# Patient Record
Sex: Male | Born: 1940 | Race: Black or African American | Hispanic: No | Marital: Married | Smoking: Never smoker
Health system: Southern US, Community
[De-identification: ages and names within clinical notes are randomized; demographics above are authoritative.]

## PROBLEM LIST (undated history)

## (undated) DIAGNOSIS — I10 Essential (primary) hypertension: Secondary | ICD-10-CM

## (undated) DIAGNOSIS — E785 Hyperlipidemia, unspecified: Secondary | ICD-10-CM

---

## 2017-10-24 ENCOUNTER — Other Ambulatory Visit: Payer: Self-pay

## 2017-10-24 ENCOUNTER — Observation Stay
Admission: EM | Admit: 2017-10-24 | Discharge: 2017-10-25 | Disposition: A | Discharge status: Home / Self Care | Payer: Medicare Other | Attending: Internal Medicine | Admitting: Internal Medicine

## 2017-10-24 ENCOUNTER — Emergency Department: Payer: Medicare Other

## 2017-10-24 ENCOUNTER — Encounter: Payer: Self-pay | Admitting: Emergency Medicine

## 2017-10-24 DIAGNOSIS — I083 Combined rheumatic disorders of mitral, aortic and tricuspid valves: Secondary | ICD-10-CM | POA: Diagnosis not present

## 2017-10-24 DIAGNOSIS — Z79899 Other long term (current) drug therapy: Secondary | ICD-10-CM | POA: Diagnosis not present

## 2017-10-24 DIAGNOSIS — R42 Dizziness and giddiness: Secondary | ICD-10-CM

## 2017-10-24 DIAGNOSIS — Z7982 Long term (current) use of aspirin: Secondary | ICD-10-CM | POA: Diagnosis not present

## 2017-10-24 DIAGNOSIS — E876 Hypokalemia: Secondary | ICD-10-CM | POA: Insufficient documentation

## 2017-10-24 DIAGNOSIS — R55 Syncope and collapse: Principal | ICD-10-CM | POA: Insufficient documentation

## 2017-10-24 DIAGNOSIS — I208 Other forms of angina pectoris: Secondary | ICD-10-CM | POA: Diagnosis present

## 2017-10-24 DIAGNOSIS — E785 Hyperlipidemia, unspecified: Secondary | ICD-10-CM | POA: Diagnosis not present

## 2017-10-24 DIAGNOSIS — I10 Essential (primary) hypertension: Secondary | ICD-10-CM | POA: Insufficient documentation

## 2017-10-24 HISTORY — DX: Hyperlipidemia, unspecified: E78.5

## 2017-10-24 HISTORY — DX: Essential (primary) hypertension: I10

## 2017-10-24 LAB — ETHANOL: Alcohol, Ethyl (B): <10 mg/dL (ref <10)

## 2017-10-24 LAB — CBC WITH DIFFERENTIAL/PLATELET
BASOS ABS: 0.1 10*3/uL (ref 0–0.1)
BASOS PCT: 1 %
EOS PCT: 2 %
Eosinophils Absolute: 0.2 10*3/uL (ref 0–0.7)
HCT: 48.2 % (ref 40.0–52.0)
Hemoglobin: 16.3 g/dL (ref 13.0–18.0)
Lymphocytes Relative: 9 %
Lymphs Abs: 0.8 10*3/uL — ABNORMAL LOW (ref 1.0–3.6)
MCH: 30 pg (ref 26.0–34.0)
MCHC: 33.9 g/dL (ref 32.0–36.0)
MCV: 88.6 fL (ref 80.0–100.0)
MONO ABS: 0.7 10*3/uL (ref 0.2–1.0)
Monocytes Relative: 8 %
Neutro Abs: 7 10*3/uL — ABNORMAL HIGH (ref 1.4–6.5)
Neutrophils Relative %: 80 %
PLATELETS: 201 10*3/uL (ref 150–440)
RBC: 5.45 MIL/uL (ref 4.40–5.90)
RDW: 13.9 % (ref 11.5–14.5)
WBC: 8.9 10*3/uL (ref 3.8–10.6)

## 2017-10-24 LAB — COMPREHENSIVE METABOLIC PANEL
ALT: 19 U/L (ref 17–63)
AST: 28 U/L (ref 15–41)
Albumin: 3.7 g/dL (ref 3.5–5.0)
Alkaline Phosphatase: 68 U/L (ref 38–126)
Anion gap: 10 (ref 5–15)
BILIRUBIN TOTAL: 0.9 mg/dL (ref 0.3–1.2)
BUN: 17 mg/dL (ref 6–20)
CALCIUM: 9.2 mg/dL (ref 8.9–10.3)
CO2: 25 mmol/L (ref 22–32)
CREATININE: 1.14 mg/dL (ref 0.61–1.24)
Chloride: 103 mmol/L (ref 101–111)
GFR calc Af Amer: >60 mL/min (ref >60)
GFR calc non Af Amer: 59 mL/min — ABNORMAL LOW (ref >60)
Glucose, Bld: 101 mg/dL — ABNORMAL HIGH (ref 65–99)
Potassium: 3.6 mmol/L (ref 3.5–5.1)
SODIUM: 138 mmol/L (ref 135–145)
TOTAL PROTEIN: 7.9 g/dL (ref 6.5–8.1)

## 2017-10-24 LAB — URINALYSIS, COMPLETE (UACMP) WITH MICROSCOPIC
BILIRUBIN URINE: NEGATIVE
GLUCOSE, UA: NEGATIVE mg/dL
Hgb urine dipstick: NEGATIVE
KETONES UR: NEGATIVE mg/dL
LEUKOCYTES UA: NEGATIVE
Nitrite: NEGATIVE
PROTEIN: NEGATIVE mg/dL
Specific Gravity, Urine: 1.017 (ref 1.005–1.030)
pH: 6 (ref 5.0–8.0)

## 2017-10-24 LAB — URINE DRUG SCREEN, QUALITATIVE (ARMC ONLY)
Amphetamines, Ur Screen: NOT DETECTED
BENZODIAZEPINE, UR SCRN: NOT DETECTED
CANNABINOID 50 NG, UR ~~LOC~~: NOT DETECTED
Cocaine Metabolite,Ur ~~LOC~~: NOT DETECTED
MDMA (Ecstasy)Ur Screen: NOT DETECTED
METHADONE SCREEN, URINE: NOT DETECTED
OPIATE, UR SCREEN: NOT DETECTED
PHENCYCLIDINE (PCP) UR S: NOT DETECTED
Tricyclic, Ur Screen: NOT DETECTED

## 2017-10-24 LAB — BRAIN NATRIURETIC PEPTIDE: B NATRIURETIC PEPTIDE 5: 48 pg/mL (ref 0.0–100.0)

## 2017-10-24 LAB — TROPONIN I
Troponin I: 0.03 ng/mL (ref <0.03)
Troponin I: <0.03 ng/mL (ref <0.03)

## 2017-10-24 LAB — PROTIME-INR
INR: 1.02
PROTHROMBIN TIME: 13.3 s (ref 11.4–15.2)

## 2017-10-24 LAB — GLUCOSE, CAPILLARY: Glucose-Capillary: 119 mg/dL — ABNORMAL HIGH (ref 65–99)

## 2017-10-24 LAB — APTT: aPTT: 24 seconds (ref 24–36)

## 2017-10-24 IMAGING — DX DG CHEST 1V PORT
2 series · 2 of 2 positions shown · non-contrast
Comparison: None.

CLINICAL DATA: Chest pain with weakness

EXAM:
PORTABLE CHEST 1 VIEW

[chest ap (1 of 2)]
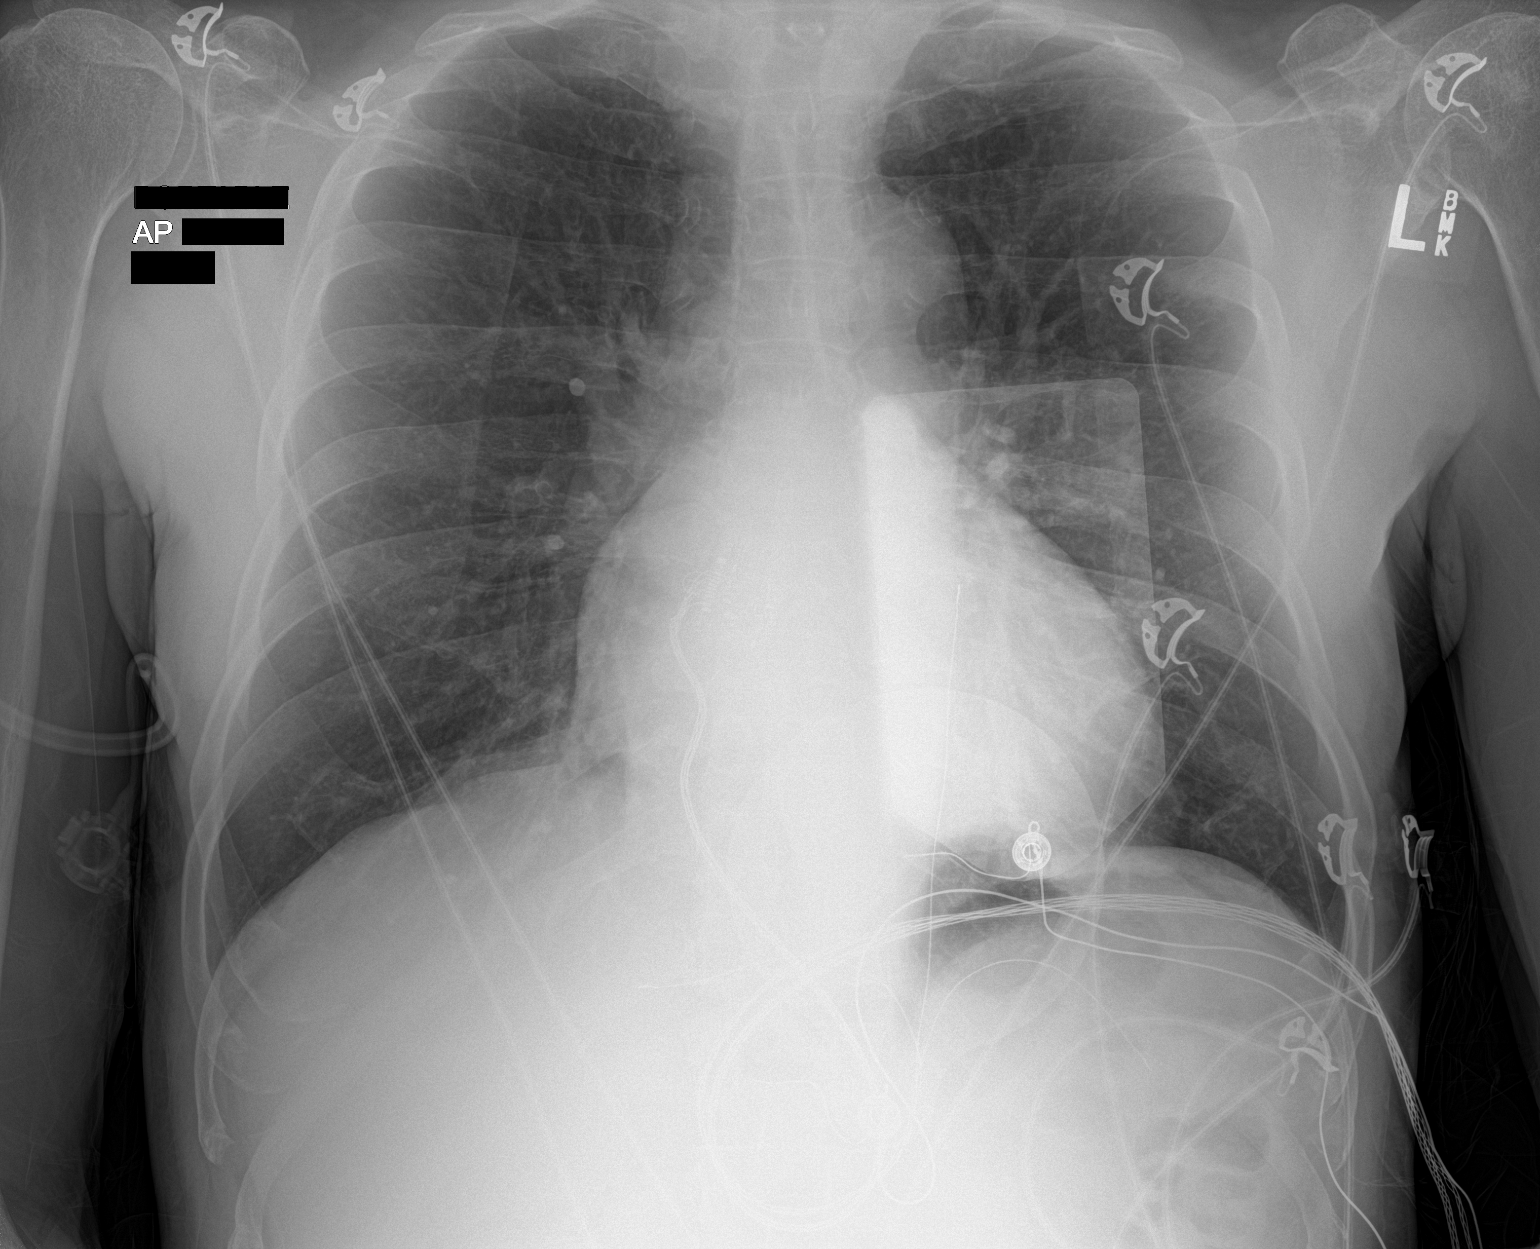

[chest ap (2 of 2)]
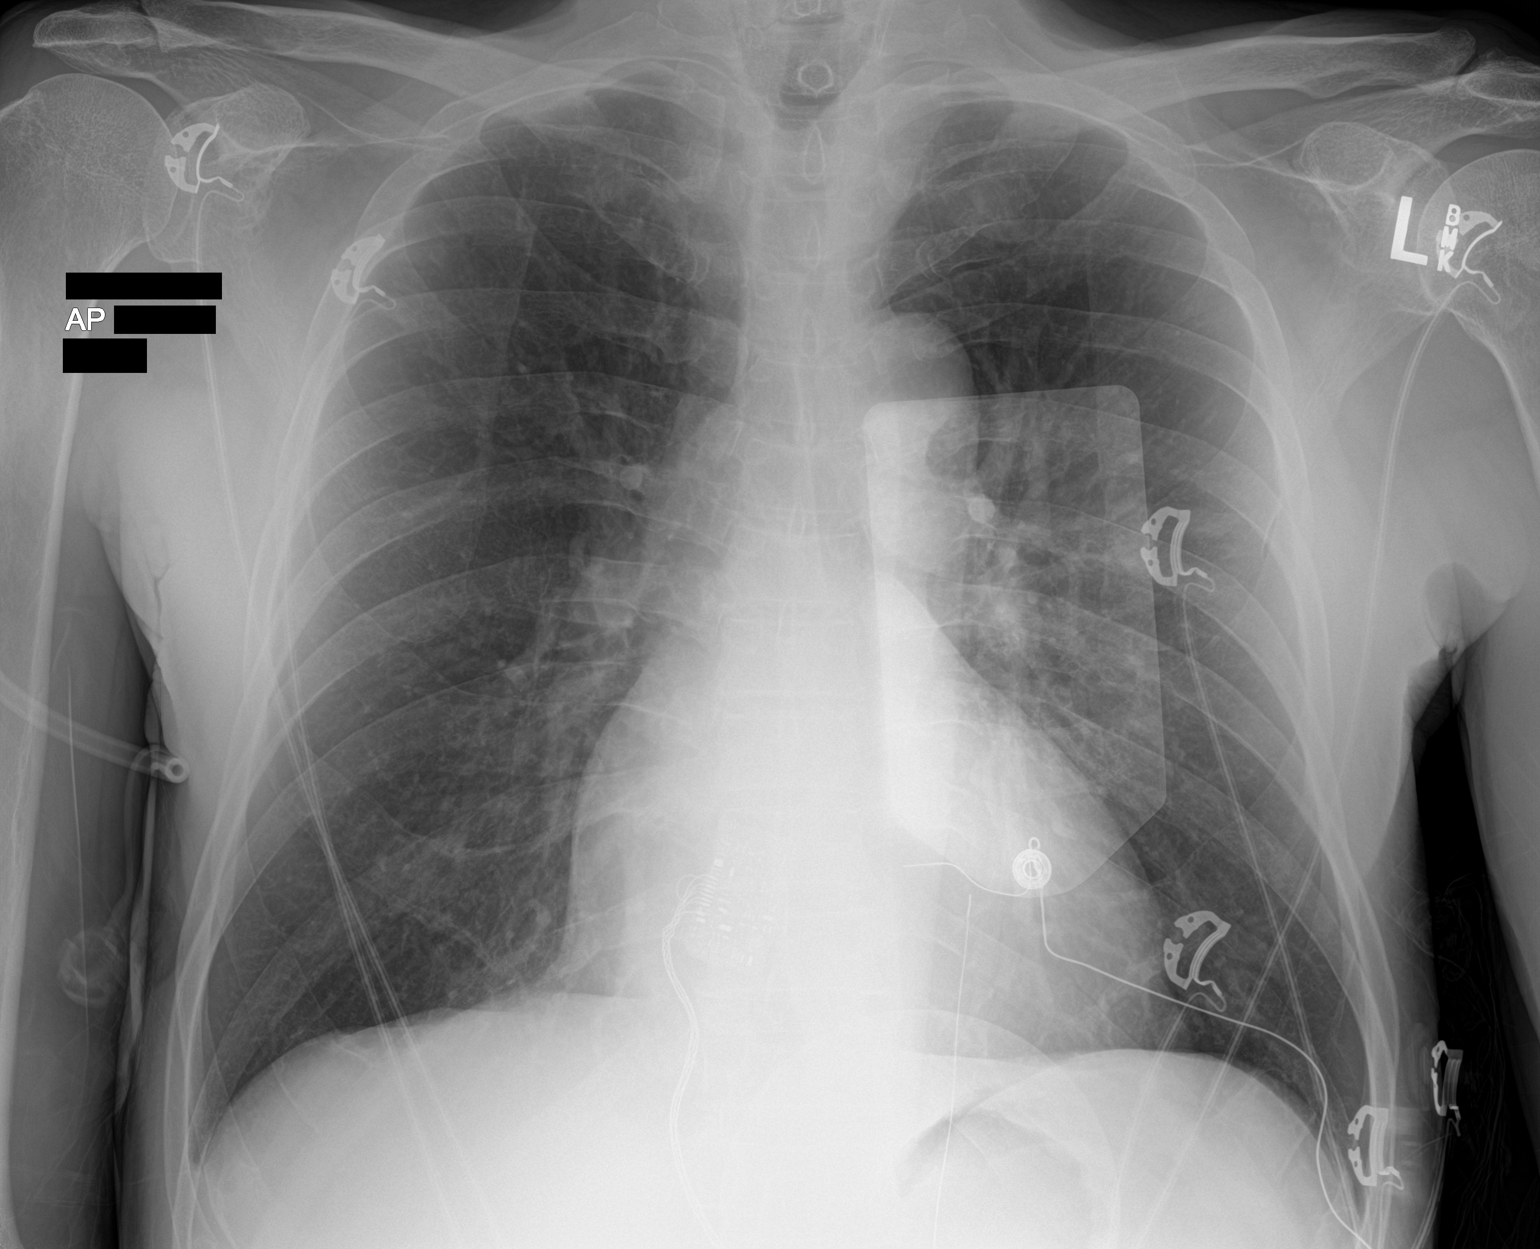

[2 of 2 positions shown; findings below may reference images not displayed]

FINDINGS: No focal airspace disease or pleural effusion. Heart size upper
limits of normal. No pneumothorax.
IMPRESSION: No active disease.

## 2017-10-24 MED ORDER — ONDANSETRON HCL 4 MG/2ML IJ SOLN: 4.0000 mg | Freq: Four times a day (QID) | INTRAMUSCULAR | Status: DC | PRN

## 2017-10-24 MED ORDER — ACETAMINOPHEN 325 MG PO TABS: 650.0000 mg | ORAL_TABLET | Freq: Four times a day (QID) | ORAL | Status: DC | PRN

## 2017-10-24 MED ORDER — ACETAMINOPHEN 650 MG RE SUPP: 650.0000 mg | Freq: Four times a day (QID) | RECTAL | Status: DC | PRN

## 2017-10-24 MED ORDER — ALBUTEROL SULFATE (2.5 MG/3ML) 0.083% IN NEBU: 2.5000 mg | INHALATION_SOLUTION | RESPIRATORY_TRACT | Status: DC | PRN

## 2017-10-24 MED ORDER — HEPARIN (PORCINE) IN NACL 100-0.45 UNIT/ML-% IJ SOLN
850.0000 [IU]/h | INTRAMUSCULAR | Status: DC
  Administered 2017-10-24: 850 [IU]/h via INTRAVENOUS
  Filled 2017-10-24: qty 250

## 2017-10-24 MED ORDER — SODIUM CHLORIDE 0.9% FLUSH: 3.0000 mL | Freq: Two times a day (BID) | INTRAVENOUS | Status: DC

## 2017-10-24 MED ORDER — POLYETHYLENE GLYCOL 3350 17 G PO PACK: 17.0000 g | PACK | Freq: Every day | ORAL | Status: DC | PRN

## 2017-10-24 MED ORDER — SODIUM CHLORIDE 0.9 % IV BOLUS
500.0000 mL | Freq: Once | INTRAVENOUS | Status: AC
  Administered 2017-10-24: 500 mL via INTRAVENOUS

## 2017-10-24 MED ORDER — PRAVASTATIN SODIUM 40 MG PO TABS: 40.0000 mg | ORAL_TABLET | Freq: Every day | ORAL | Status: DC

## 2017-10-24 MED ORDER — ONDANSETRON HCL 4 MG PO TABS: 4.0000 mg | ORAL_TABLET | Freq: Four times a day (QID) | ORAL | Status: DC | PRN

## 2017-10-24 MED ORDER — LIDOCAINE HCL (PF) 1 % IJ SOLN
INTRAMUSCULAR | Status: AC
  Filled 2017-10-24: qty 30

## 2017-10-24 MED ORDER — HEPARIN BOLUS VIA INFUSION
4000.0000 [IU] | Freq: Once | INTRAVENOUS | Status: AC
  Administered 2017-10-24: 4000 [IU] via INTRAVENOUS
  Filled 2017-10-24: qty 4000

## 2017-10-24 MED ORDER — ASPIRIN EC 81 MG PO TBEC: 81.0000 mg | DELAYED_RELEASE_TABLET | Freq: Every day | ORAL | Status: DC

## 2017-10-24 NOTE — Progress Notes (Signed)
   10/24/17 1810  Clinical Encounter Type  Visited With Family (fiance of patient)  Visit Type Initial   Code STEMI page. Chaplain made contact with fianc of patient, awaiting more family to arrive.  Patient being treated by medical staff, but Code STEMI was cancelled. Provided emotional support to fianc and encouraged her to request on-call chaplain if needed.

## 2017-10-24 NOTE — H&P (Signed)
[employer] - [place] [facility]   PATIENT NAME: Joel Morgan    MR#:  [id number]  DATE OF BIRTH:  Feb 26, 1941  DATE OF ADMISSION:  10/24/2017  PRIMARY CARE PHYSICIAN: Leanna SatoMiles, Linda M, MD   REQUESTING/REFERRING PHYSICIAN: Dr. Alphonzo LemmingsMcshane  CHIEF COMPLAINT:   Chief Complaint  Patient presents with  . Chest Pain    HISTORY OF PRESENT ILLNESS:  Joel Morgan  is a 77 y.o. male with a known history of hypertension, hyperlipidemia presented to the emergency room due to episode of almost passing out and severe diaphoresis. EMS saw the patient and an EKG was checked and code NSTEMI activated. EKG showed mild ST elevation in inferior leads and reciprocal changes with ST depression in inferior leads. Troponin normal. Seen by cardiology. Did not think this was ST elevation MI. Patient is being admitted on heparin drip. Presently patient feels back to normal. No concerns other than some fatigue.  PAST MEDICAL HISTORY:   Past Medical History:  Diagnosis Date  . Hyperlipidemia   . Hypertension     PAST SURGICAL HISTORY:  History reviewed. No pertinent surgical history.  SOCIAL HISTORY:   Social History   Tobacco Use  . Smoking status: Never Smoker  . Smokeless tobacco: Never Used  Substance Use Topics  . Alcohol use: Not Currently    Frequency: Never    FAMILY HISTORY:  History reviewed. No pertinent family history. no premature CAD in the family  DRUG ALLERGIES:  No Known Allergies  REVIEW OF SYSTEMS:   Review of Systems  Constitutional: Positive for diaphoresis and malaise/fatigue. Negative for chills and fever.  HENT: Negative for sore throat.   Eyes: Negative for blurred vision, double vision and pain.  Respiratory: Negative for cough, hemoptysis, shortness of breath and wheezing.   Cardiovascular: Negative for chest pain, palpitations, orthopnea and leg swelling.  Gastrointestinal: Negative for abdominal pain, constipation, diarrhea, heartburn,  nausea and vomiting.  Genitourinary: Negative for dysuria and hematuria.  Musculoskeletal: Negative for back pain and joint pain.  Skin: Negative for rash.  Neurological: Positive for dizziness. Negative for sensory change, speech change, focal weakness and headaches.  Endo/Heme/Allergies: Does not bruise/bleed easily.  Psychiatric/Behavioral: Negative for depression. The patient is not nervous/anxious.     MEDICATIONS AT HOME:   Prior to Admission medications   Not on File     VITAL SIGNS:  Blood pressure (!) 144/84, pulse 64, temperature 99.1 F (37.3 C), temperature source Oral, resp. rate 16, height 6' (1.829 m), weight 71.1 kg (156 lb 11.2 oz), SpO2 95 %.  PHYSICAL EXAMINATION:  Physical Exam  GENERAL:  77 y.o.-year-old patient lying in the bed with no acute distress.  EYES: Pupils equal, round, reactive to light and accommodation. No scleral icterus. Extraocular muscles intact.  HEENT: Head atraumatic, normocephalic. Oropharynx and nasopharynx clear. No oropharyngeal erythema, moist oral mucosa  NECK:  Supple, no jugular venous distention. No thyroid enlargement, no tenderness.  LUNGS: Normal breath sounds bilaterally, no wheezing, rales, rhonchi. No use of accessory muscles of respiration.  CARDIOVASCULAR: S1, S2 normal. No murmurs, rubs, or gallops.  ABDOMEN: Soft, nontender, nondistended. Bowel sounds present. No organomegaly or mass.  EXTREMITIES: No pedal edema, cyanosis, or clubbing. + 2 pedal & radial pulses b/l.   NEUROLOGIC: Cranial nerves II through XII are intact. No focal Motor or sensory deficits appreciated b/l PSYCHIATRIC: The patient is alert and oriented x 3. Good affect.  SKIN: No obvious rash, lesion, or ulcer.   LABORATORY PANEL:  CBC Recent Labs  Lab 10/24/17 1820  WBC 8.9  HGB 16.3  HCT 48.2  PLT 201   ------------------------------------------------------------------------------------------------------------------  Chemistries  Recent Labs   Lab 10/24/17 1820  NA 138  K 3.6  CL 103  CO2 25  GLUCOSE 101*  BUN 17  CREATININE 1.14  CALCIUM 9.2  AST 28  ALT 19  ALKPHOS 68  BILITOT 0.9   ------------------------------------------------------------------------------------------------------------------  Cardiac Enzymes Recent Labs  Lab 10/24/17 1820  TROPONINI 0.03*   ------------------------------------------------------------------------------------------------------------------  RADIOLOGY:  Dg Chest Portable 1 View  Result Date: 10/24/2017 CLINICAL DATA:  Chest pain with weakness EXAM: PORTABLE CHEST 1 VIEW COMPARISON:  None. FINDINGS: No focal airspace disease or pleural effusion. Heart size upper limits of normal. No pneumothorax. IMPRESSION: No active disease. Electronically Signed   By: Jasmine Pang M.D.   On: 10/24/2017 18:55     IMPRESSION AND PLAN:   *Angina equivalent patient presented with pre-syncope and severe diaphoresis. His EKG initially showed some ST elevation and lateral leads and reciprocal changes in the inferior leads. These have improved on repeat EKG. Troponin normal. Code STEMI was activated. Cardiology Dr. Adrienne Mocha has seen the patient and reviewed EKG. Do not think this was ST elevation MI. Troponin normal. Patient is being admitted on heparin drip. Will start aspirin, statin. Check echocardiogram. Patient will be kept NPO after midnight in anticipation of cardiac catheterization in the morning. Check orthostatic vitals.  *Hypertension. Patient on hydrochlorothiazide at home. Will hold this.  All the records are reviewed and case discussed with ED provider. Management plans discussed with the patient, family and they are in agreement.  CODE STATUS: FULL CODE  TOTAL TIME TAKING CARE OF THIS PATIENT: 40 minutes.   Molinda Bailiff Sebrena Engh M.D on 10/24/2017 at 8:07 PM  Between 7am to 6pm - Pager - [phone]  After 6pm go to [email or web address] - password [email or web address] [facility]  [facility] [place]  [facility]  Office  [phone]  CC: Primary care physician; Leanna Sato, MD  Note: This dictation was prepared with Dragon dictation along with smaller phrase technology. Any transcriptional errors that result from this process are unintentional.

## 2017-10-24 NOTE — ED Triage Notes (Signed)
Pt to ED by EMS with c/o of increased weakness. Per EMS pt had EKG that was concerning and possible STEMI. Pt has no c/o chest pain or SOB.

## 2017-10-24 NOTE — ED Provider Notes (Addendum)
[facility] Emergency Department Provider Note  ____________________________________________   I have reviewed the triage vital signs and the nursing notes. Where available I have reviewed prior notes and, if possible and indicated, outside hospital notes.    HISTORY  Chief Complaint Chest Pain    HPI Joel Morgan is a 77 y.o. male immediately upon arrival to room, patient is here as a "STEMI".  Patient has history of hypertension hyperlipidemia last negative stress test was in April of this year.  Patient states that he has been feeling generally fatigued today.  He states he had an episode where he became diaphoretic while he was sitting down and thought he might pass out or actually did but did not fall.  He was "kind of out of it" for a few minutes.  EMS was called when they got there they had an EKG which showed RSR prime configuration with ST elevations anteriorly and reciprocal changes inferiorly, EMS began ACLS protocol and gave him nitroglycerin and aspirin, they were not able to get a blood pressure after that, however patient at no time had any chest pain or shortness of breath he states sometimes he does have exertional shortness of breath but no more than usual recently.  Patient states that he has had no leg swelling no rectal bleeding no fever no chills.  He has had minor URI symptoms with rhinorrhea and cold.  He denies falling or hitting his head. This time he feels "pretty good".  Past Medical History:  Diagnosis Date  . Hyperlipidemia   . Hypertension     There are no active problems to display for this patient.   History reviewed. No pertinent surgical history.  Prior to Admission medications   Not on File    Allergies Patient has no known allergies.  History reviewed. No pertinent family history.  Social History Social History   Tobacco Use  . Smoking status: Never Smoker  . Smokeless tobacco: Never Used  Substance Use  Topics  . Alcohol use: Not Currently    Frequency: Never  . Drug use: Never    Review of Systems Constitutional: No fever/chills Eyes: No visual changes. ENT: No sore throat. No stiff neck no neck pain Cardiovascular: Denies chest pain. Respiratory: Denies shortness of breath. Gastrointestinal:   no vomiting.  No diarrhea.  No constipation. Genitourinary: Negative for dysuria. Musculoskeletal: Negative lower extremity swelling Skin: Negative for rash. Neurological: Negative for severe headaches, focal weakness or numbness.   ____________________________________________   PHYSICAL EXAM:  VITAL SIGNS: ED Triage Vitals  Enc Vitals Group     BP 10/24/17 1825 (!) 80/60     Pulse Rate 10/24/17 1825 64     Resp 10/24/17 1825 15     Temp 10/24/17 1825 99.1 F (37.3 C)     Temp Source 10/24/17 1825 Oral     SpO2 10/24/17 1825 95 %     Weight 10/24/17 1831 156 lb 11.2 oz (71.1 kg)     Height 10/24/17 1831 6' (1.829 m)     Head Circumference --      Peak Flow --      Pain Score 10/24/17 1825 0     Pain Loc --      Pain Edu? --      Excl. in GC? --     Constitutional: Alert and oriented. Well appearing and in no acute distress. Eyes: Conjunctivae are normal Head: Atraumatic HEENT: No congestion/rhinnorhea. Mucous membranes are moist.  Oropharynx non-erythematous  Neck:   Nontender with no meningismus, no masses, no stridor Cardiovascular: Normal rate, regular rhythm. Grossly normal heart sounds.  Good peripheral circulation. Respiratory: Normal respiratory effort.  No retractions. Lungs CTAB. Abdominal: Soft and nontender. No distention. No guarding no rebound Back:  There is no focal tenderness or step off.  there is no midline tenderness there are no lesions noted. there is no CVA tenderness Musculoskeletal: No lower extremity tenderness, no upper extremity tenderness. No joint effusions, no DVT signs strong distal pulses no edema Neurologic:  Normal speech and language. No  gross focal neurologic deficits are appreciated.  Skin:  Skin is warm, dry and intact. No rash noted. Psychiatric: Mood and affect are normal. Speech and behavior are normal.  ____________________________________________   LABS (all labs ordered are listed, but only abnormal results are displayed)  Labs Reviewed  GLUCOSE, CAPILLARY - Abnormal; Notable for the following components:      Result Value   Glucose-Capillary 119 (*)    All other components within normal limits  COMPREHENSIVE METABOLIC PANEL  CBC WITH DIFFERENTIAL/PLATELET  TROPONIN I  BRAIN NATRIURETIC PEPTIDE  PROTIME-INR  APTT    Pertinent labs  results that were available during my care of the patient were reviewed by me and considered in my medical decision making (see chart for details). ____________________________________________  EKG  I personally interpreted any EKGs ordered by me or triage EKG from EMS shows RSR prime configuration with some ST elevation which could be repolarization, flipped T waves noted to 3 and aVF, we do not have an old EKG to compare to, EKG here shows RSR prime configuration with ST elevation which could be repolarization in V1 V2, flipped T waves laterally and no reciprocal changes noted inferiorly. ____________________________________________  RADIOLOGY  Pertinent labs & imaging results that were available during my care of the patient were reviewed by me and considered in my medical decision making (see chart for details). If possible, patient and/or family made aware of any abnormal findings.  No results found. ____________________________________________    PROCEDURES  Procedure(s) performed: None  Procedures  Critical Care performed: CRITICAL CARE Performed by: Jeanmarie Plant   Total critical care time: 39 minutes  Critical care time was exclusive of separately billable procedures and treating other patients.  Critical care was necessary to treat or prevent  imminent or life-threatening deterioration.  Critical care was time spent personally by me on the following activities: development of treatment plan with patient and/or surrogate as well as nursing, discussions with consultants, evaluation of patient's response to treatment, examination of patient, obtaining history from patient or surrogate, ordering and performing treatments and interventions, ordering and review of laboratory studies, ordering and review of radiographic studies, pulse oximetry and re-evaluation of patient's condition.   ____________________________________________   INITIAL IMPRESSION / ASSESSMENT AND PLAN / ED COURSE  Pertinent labs & imaging results that were available during my care of the patient were reviewed by me and considered in my medical decision making (see chart for details).  She with syncopal or presyncopal symptoms today, but with no chest pain or shortness of breath, initial blood pressure was in the 80s after he received nitroglycerin from EMS, gave IV fluids that rapidly directed.  He has no symptoms at this time..  EKG at this time is nondiagnostic of definitive STEMI, old EKGs from notes show that he has a repull abnormality with ST elevation it appears according to cardiology.  I was able to talk to cardiologist, Dr. Darrold Junker,  who did come and evaluate the patient.  He does not feel the patient meets criteria for emergent trip to the Cath Lab especially as he has no chest pain or shortness of breath.  Or EKG certainly is not sufficient in his estimation to mandate that.  He does agree with a general work-up for syncope and lightheadedness which we have initiated, no evidence of head trauma.  We will admit the patient.  If that changes in any way including chest pain etc. we will reconsult cardiology in the emergency department.  He does feel that if no other acute cause of patient's symptoms is found, that heparinization would not be a bad plan but he does  wish to wait for blood work etc. to make sure that there are no other causes such as GI bleed etc.  Historically however this is not something the patient complains of.  Patient remains asymptomatic with a reassuring blood pressure and vitals at this time    ____________________________________________   FINAL CLINICAL IMPRESSION(S) / ED DIAGNOSES  Final diagnoses:  None      This chart was dictated using voice recognition software.  Despite best efforts to proofread,  errors can occur which can change meaning.      Jeanmarie Plant, MD 10/24/17 1844    Jeanmarie Plant, MD 10/24/17 [phone]

## 2017-10-24 NOTE — Consult Note (Signed)
ANTICOAGULATION CONSULT NOTE - Initial Consult  Pharmacy Consult for heparin drip Indication: chest pain/ACS  No Known Allergies  Patient Measurements: Height: 6' (182.9 cm) Weight: 156 lb 11.2 oz (71.1 kg) IBW/kg (Calculated) : 77.6 Heparin Dosing Weight: 71.1kg  Vital Signs: Temp: 99.1 F (37.3 C) (06/21 1825) Temp Source: Oral (06/21 1825) BP: 144/84 (06/21 1830) Pulse Rate: 64 (06/21 1830)  Labs: Recent Labs    10/24/17 1820  HGB 16.3  HCT 48.2  PLT 201  APTT 24  LABPROT 13.3  INR 1.02  CREATININE 1.14  TROPONINI 0.03*    Estimated Creatinine Clearance: 52.8 mL/min (by C-G formula based on SCr of 1.14 mg/dL).   Medical History: Past Medical History:  Diagnosis Date  . Hyperlipidemia   . Hypertension     Medications:  Scheduled:  . heparin  4,000 Units Intravenous Once    Assessment: Patient is a 77 year old male who initially was called a code STEMI, but this was canceled. Pharmacy consulted to dose heparin drip for possible ACS. First troponin slightly elevated. Baseline labs WNL. No anticoagulation PTA.  Goal of Therapy:  Heparin level 0.3-0.7 units/ml Monitor platelets by anticoagulation protocol: Yes   Plan:  Give 4000 units bolus x 1 Start heparin infusion at 850 units/hr Check anti-Xa level in 6 hours and daily while on heparin Continue to monitor H&H and platelets  Ranie Chinchilla D Tiasha Helvie, Pharm.D, BCPS Clinical Pharmacist  10/24/2017,7:20 PM

## 2017-10-25 ENCOUNTER — Inpatient Hospital Stay: Payer: Medicare Other

## 2017-10-25 ENCOUNTER — Inpatient Hospital Stay
Admit: 2017-10-25 | Discharge: 2017-10-25 | Disposition: A | Discharge status: Home / Self Care | Payer: Medicare Other | Attending: Internal Medicine | Admitting: Internal Medicine

## 2017-10-25 ENCOUNTER — Other Ambulatory Visit: Payer: Self-pay

## 2017-10-25 DIAGNOSIS — R42 Dizziness and giddiness: Secondary | ICD-10-CM

## 2017-10-25 DIAGNOSIS — R55 Syncope and collapse: Secondary | ICD-10-CM

## 2017-10-25 LAB — NM MYOCAR MULTI W/SPECT W/WALL MOTION / EF
CHL CUP MPHR: 141 {beats}/min
Estimated workload: 1 METS
Exercise duration (min): 1 min
Exercise duration (sec): 0 s
LVDIAVOL: 83 mL (ref 62–150)
LVSYSVOL: 32 mL
NUC STRESS TID: 1.07
Peak HR: 89 {beats}/min
Percent HR: 63 %
Rest HR: 57 {beats}/min
SDS: 5
SRS: 0
SSS: 5

## 2017-10-25 LAB — ECHOCARDIOGRAM COMPLETE
Height: 72 in
Weight: 2448 oz

## 2017-10-25 LAB — BASIC METABOLIC PANEL
ANION GAP: 11 (ref 5–15)
BUN: 18 mg/dL (ref 6–20)
CALCIUM: 8.7 mg/dL — AB (ref 8.9–10.3)
CO2: 23 mmol/L (ref 22–32)
CREATININE: 0.94 mg/dL (ref 0.61–1.24)
Chloride: 103 mmol/L (ref 101–111)
GFR calc Af Amer: >60 mL/min (ref >60)
GLUCOSE: 91 mg/dL (ref 65–99)
Potassium: 3.4 mmol/L — ABNORMAL LOW (ref 3.5–5.1)
Sodium: 137 mmol/L (ref 135–145)

## 2017-10-25 LAB — TROPONIN I: TROPONIN I: 0.03 ng/mL — AB (ref <0.03)

## 2017-10-25 LAB — HEPARIN LEVEL (UNFRACTIONATED): Heparin Unfractionated: 0.53 IU/mL (ref 0.30–0.70)

## 2017-10-25 MED ORDER — REGADENOSON 0.4 MG/5ML IV SOLN
0.4000 mg | Freq: Once | INTRAVENOUS | Status: AC
  Administered 2017-10-25: 0.4 mg via INTRAVENOUS

## 2017-10-25 MED ORDER — LOSARTAN POTASSIUM 50 MG PO TABS: 50.0000 mg | ORAL_TABLET | Freq: Every day | ORAL | 2 refills | Status: AC

## 2017-10-25 MED ORDER — TECHNETIUM TC 99M TETROFOSMIN IV KIT
13.8300 | PACK | Freq: Once | INTRAVENOUS | Status: AC | PRN
  Administered 2017-10-25: 13.83 via INTRAVENOUS

## 2017-10-25 MED ORDER — POTASSIUM CHLORIDE CRYS ER 20 MEQ PO TBCR: 40.0000 meq | EXTENDED_RELEASE_TABLET | Freq: Once | ORAL | Status: DC

## 2017-10-25 MED ORDER — ASPIRIN 81 MG PO TBEC: 81.0000 mg | DELAYED_RELEASE_TABLET | Freq: Every day | ORAL | 2 refills | Status: DC

## 2017-10-25 MED ORDER — AMLODIPINE BESYLATE 5 MG PO TABS: 5.0000 mg | ORAL_TABLET | Freq: Every day | ORAL | 3 refills | Status: DC

## 2017-10-25 MED ORDER — TECHNETIUM TC 99M TETROFOSMIN IV KIT
32.8700 | PACK | Freq: Once | INTRAVENOUS | Status: AC | PRN
  Administered 2017-10-25: 32.87 via INTRAVENOUS

## 2017-10-25 NOTE — Care Management CC44 (Signed)
Condition Code 44 Documentation Completed  Patient Details  Name: Joel MunroJoseph L Morgan MRN: [id number] Date of Birth: Sep 11, 1940   Condition Code 44 given:  Yes Patient signature on Condition Code 44 notice:  Yes Documentation of 2 MD's agreement:  Yes Code 44 added to claim:  Yes    Virgel ManifoldJosh A Ramiya Delahunty, RN 10/25/2017, 1:08 PM

## 2017-10-25 NOTE — Care Management CC44 (Signed)
Condition Code 44 Documentation Completed  Patient Details  Name: Jonna MunroJoseph L Certain MRN: [id number] Date of Birth: Oct 24, 1940   Condition Code 44 given:    Patient signature on Condition Code 44 notice:    Documentation of 2 MD's agreement:    Code 44 added to claim:       Virgel ManifoldJosh A Lima Chillemi, RN 10/25/2017, 1:08 PM

## 2017-10-25 NOTE — Discharge Summary (Addendum)
[facility]   PATIENT NAME: Joel Morgan    MR#:  [id number]  DATE OF BIRTH:  09-30-1940  DATE OF ADMISSION:  10/24/2017   ADMITTING PHYSICIAN: Milagros Loll, MD  DATE OF DISCHARGE: 10/25/17  PRIMARY CARE PHYSICIAN: Leanna Sato, MD   ADMISSION DIAGNOSIS:   Syncope, unspecified syncope type [R55]  DISCHARGE DIAGNOSIS:   Active Problems:   Anginal equivalent (HCC)   SECONDARY DIAGNOSIS:   Past Medical History:  Diagnosis Date  . Hyperlipidemia   . Hypertension     HOSPITAL COURSE:   77 year old male with past medical history significant for hypertension not on any medications at home was brought in secondary to a presyncopal episode with diaphoresis  1.  Presyncopal episode-omitted for possible anginal equivalent episode due to concerning EKG changes on admission. -Seen by cardiology.  Troponins are negative, ruled out for MI -Echocardiogram showing EF at 45%. -Stress test was done showing no active cardiac ischemia, EF is calculated to be around 45%. -Would strongly recommend for patient to follow-up with cardiology as outpatient, may be will need a cardiac catheterization to look into low ejection fraction as outpatient. Already has an outpatient cardiologist. -We will discharge on aspirin and losartan at this time. - HR is low, so no beta blocker at this time.  2.  Hypertension-discontinue HCTZ and will place patient on losartan.  3.  Hypokalemia-we will replace prior to discharge  Patient has been ambulatory, no further symptoms.  Will be discharged home today  DISCHARGE CONDITIONS:   Guarded  CONSULTS OBTAINED:   Treatment Team:  Marcina Millard, MD  DRUG ALLERGIES:   No Known Allergies DISCHARGE MEDICATIONS:   Allergies as of 10/25/2017   No Known Allergies     Medication List    STOP taking these medications   HYDROCHLOROTHIAZIDE PO     TAKE these medications   aspirin EC 81 MG  tablet Take 81 mg by mouth daily.   losartan 50 MG tablet Commonly known as:  COZAAR Take 1 tablet (50 mg total) by mouth daily.   PRAVASTATIN SODIUM PO Take 20 mg by mouth daily.        DISCHARGE INSTRUCTIONS:   1. PCP f/u in 1-2 weeks  DIET:   Cardiac diet  ACTIVITY:   Activity as tolerated  OXYGEN:   Home Oxygen: No.  Oxygen Delivery: room air  DISCHARGE LOCATION:   home   If you experience worsening of your admission symptoms, develop shortness of breath, life threatening emergency, suicidal or homicidal thoughts you must seek medical attention immediately by calling 911 or calling your MD immediately  if symptoms less severe.  You Must read complete instructions/literature along with all the possible adverse reactions/side effects for all the Medicines you take and that have been prescribed to you. Take any new Medicines after you have completely understood and accpet all the possible adverse reactions/side effects.   Please note  You were cared for by a hospitalist during your hospital stay. If you have any questions about your discharge medications or the care you received while you were in the hospital after you are discharged, you can call the unit and asked to speak with the hospitalist on call if the hospitalist that took care of you is not available. Once you are discharged, your primary care physician will handle any further medical issues. Please note that NO REFILLS for any discharge medications will be authorized once you are discharged, as it is  imperative that you return to your primary care physician (or establish a relationship with a primary care physician if you do not have one) for your aftercare needs so that they can reassess your need for medications and monitor your lab values.    On the day of Discharge:  VITAL SIGNS:   Blood pressure (!) 153/94, pulse (!) 59, temperature 97.8 F (36.6 C), temperature source Oral, resp. rate 18, height 6'  (1.829 m), weight 69.4 kg (153 lb), SpO2 99 %.  PHYSICAL EXAMINATION:    GENERAL:  77 y.o.-year-old patient lying in the bed with no acute distress.  EYES: Pupils equal, round, reactive to light and accommodation. No scleral icterus. Extraocular muscles intact.  HEENT: Head atraumatic, normocephalic. Oropharynx and nasopharynx clear.  NECK:  Supple, no jugular venous distention. No thyroid enlargement, no tenderness.  LUNGS: Normal breath sounds bilaterally, no wheezing, rales,rhonchi or crepitation. No use of accessory muscles of respiration.  CARDIOVASCULAR: S1, S2 normal. No  rubs, or gallops. 2/6 systolic murmur present ABDOMEN: Soft, non-tender, non-distended. Bowel sounds present. No organomegaly or mass.  EXTREMITIES: No pedal edema, cyanosis, or clubbing.  NEUROLOGIC: Cranial nerves II through XII are intact. Muscle strength 5/5 in all extremities. Sensation intact. Gait not checked.  PSYCHIATRIC: The patient is alert and oriented x 3.  SKIN: No obvious rash, lesion, or ulcer.   DATA REVIEW:   CBC Recent Labs  Lab 10/24/17 1820  WBC 8.9  HGB 16.3  HCT 48.2  PLT 201    Chemistries  Recent Labs  Lab 10/24/17 1820 10/25/17 0357  NA 138 137  K 3.6 3.4*  CL 103 103  CO2 25 23  GLUCOSE 101* 91  BUN 17 18  CREATININE 1.14 0.94  CALCIUM 9.2 8.7*  AST 28  --   ALT 19  --   ALKPHOS 68  --   BILITOT 0.9  --      Microbiology Results  No results found for this or any previous visit.  RADIOLOGY:  Nm Myocar Multi W/spect W/wall Motion / Ef  Result Date: 10/25/2017  Blood pressure demonstrated a normal response to exercise.  The study is normal.  This is a low risk study.  The left ventricular ejection fraction is mildly decreased (45-54%).    Dg Chest Portable 1 View  Result Date: 10/24/2017 CLINICAL DATA:  Chest pain with weakness EXAM: PORTABLE CHEST 1 VIEW COMPARISON:  None. FINDINGS: No focal airspace disease or pleural effusion. Heart size upper limits of  normal. No pneumothorax. IMPRESSION: No active disease. Electronically Signed   By: Jasmine PangKim  Fujinaga M.D.   On: 10/24/2017 18:55     Management plans discussed with the patient, family and they are in agreement.  CODE STATUS:     Code Status Orders  (From admission, onward)        Start     Ordered   10/24/17 2006  Full code  Continuous     10/24/17 2006    Code Status History    This patient has a current code status but no historical code status.      TOTAL TIME TAKING CARE OF THIS PATIENT: 38 minutes.    Enid BaasKALISETTI,Marcianne Ozbun M.D on 10/25/2017 at 12:01 PM  Between 7am to 6pm - Pager - [phone]  After 6pm go to [email or web address] - Social research officer, governmentpassword [email or web address]  [facility]  Office  [phone]  CC: Primary care physician; Leanna SatoMiles, Linda M, MD   Note: This dictation was prepared with Dragon dictation  along with smaller phrase technology. Any transcriptional errors that result from this process are unintentional.

## 2017-10-25 NOTE — Consult Note (Signed)
[facility]  CARDIOLOGY CONSULT NOTE  Patient ID: Joel MunroJoseph L Diehl MRN: [id number] DOB/AGE: 1940-12-26 77 y.o.  Admit date: 10/24/2017 Referring Physician Nemiah CommanderKalisetti Primary Physician [facility] Primary Cardiologist Gwen PoundsKowalski Reason for Consultation abnormal ECG  HPI: 77 year old gentleman referred for evaluation of abnormal ECG.  The patient presented on/05/2017 after presyncopal episode.  ECG in the field revealed ST elevation in leads V1 and II with reciprocal ST depressions in leads III and aVF.  In the emergency room, repeat ECG did not reveal her ST depression.  Patient denied chest pain or shortness of breath.  Code STEMI was called which was canceled due to lack of diagnostic ST elevation, and absence of chest pain.  She was admitted to telemetry where he is continued to do well without chest pain.  Patient has ruled out for myocardial infarction with negative troponin x3.  Review of systems complete and found to be negative unless listed above     Past Medical History:  Diagnosis Date  . Hyperlipidemia   . Hypertension     History reviewed. No pertinent surgical history.  No medications prior to admission.   Social History   Socioeconomic History  . Marital status: Married    Spouse name: Not on file  . Number of children: Not on file  . Years of education: Not on file  . Highest education level: Not on file  Occupational History  . Not on file  Social Needs  . Financial resource strain: Not on file  . Food insecurity:    Worry: Not on file    Inability: Not on file  . Transportation needs:    Medical: Not on file    Non-medical: Not on file  Tobacco Use  . Smoking status: Never Smoker  . Smokeless tobacco: Never Used  Substance and Sexual Activity  . Alcohol use: Not Currently    Frequency: Never  . Drug use: Never  . Sexual activity: Not on file  Lifestyle  . Physical activity:    Days per week: Not on file    Minutes per session: Not on file  . Stress: Not  on file  Relationships  . Social connections:    Talks on phone: Not on file    Gets together: Not on file    Attends religious service: Not on file    Active member of club or organization: Not on file    Attends meetings of clubs or organizations: Not on file    Relationship status: Not on file  . Intimate partner violence:    Fear of current or ex partner: Not on file    Emotionally abused: Not on file    Physically abused: Not on file    Forced sexual activity: Not on file  Other Topics Concern  . Not on file  Social History Narrative  . Not on file    History reviewed. No pertinent family history.    Review of systems complete and found to be negative unless listed above      PHYSICAL EXAM  General: Well developed, well nourished, in no acute distress HEENT:  Normocephalic and atramatic Neck:  No JVD.  Lungs: Clear bilaterally to auscultation and percussion. Heart: HRRR . Normal S1 and S2 without gallops or murmurs.  Abdomen: Bowel sounds are positive, abdomen soft and non-tender  Msk:  Back normal, normal gait. Normal strength and tone for age. Extremities: No clubbing, cyanosis or edema.   Neuro: Alert and oriented X 3. Psych:  Good affect,  responds appropriately  Labs:   Lab Results  Component Value Date   WBC 8.9 10/24/2017   HGB 16.3 10/24/2017   HCT 48.2 10/24/2017   MCV 88.6 10/24/2017   PLT 201 10/24/2017    Recent Labs  Lab 10/24/17 1820 10/25/17 0357  NA 138 137  K 3.6 3.4*  CL 103 103  CO2 25 23  BUN 17 18  CREATININE 1.14 0.94  CALCIUM 9.2 8.7*  PROT 7.9  --   BILITOT 0.9  --   ALKPHOS 68  --   ALT 19  --   AST 28  --   GLUCOSE 101* 91   Lab Results  Component Value Date   TROPONINI 0.03 (HH) 10/25/2017   No results found for: CHOL No results found for: HDL No results found for: LDLCALC No results found for: TRIG No results found for: CHOLHDL No results found for: LDLDIRECT    Radiology: Dg Chest Portable 1 View  Result  Date: 10/24/2017 CLINICAL DATA:  Chest pain with weakness EXAM: PORTABLE CHEST 1 VIEW COMPARISON:  None. FINDINGS: No focal airspace disease or pleural effusion. Heart size upper limits of normal. No pneumothorax. IMPRESSION: No active disease. Electronically Signed   By: Jasmine Pang M.D.   On: 10/24/2017 18:55    EKG: Sinus rhythm, diagnostic ST elevation in leads V1 and V2 distant with early repolarization, previously seen on prior ECG  ASSESSMENT AND PLAN:   1.  Presyncope, resolved, with abnormal diagnostic ECG, in the absence of chest pain, ruled out for myocardial infarction with negative troponin x3  Recommendations  1.  DC heparin 2.  Review 2D echocardiogram 3.  Lexiscan Myoview  Signed: Marcina Millard MD,PhD, [facility] 10/25/2017, 8:44 AM

## 2017-10-25 NOTE — Consult Note (Signed)
ANTICOAGULATION CONSULT NOTE - Initial Consult  Pharmacy Consult for heparin drip Indication: chest pain/ACS  No Known Allergies  Patient Measurements: Height: 6' (182.9 cm) Weight: 153 lb 11.2 oz (69.7 kg) IBW/kg (Calculated) : 77.6 Heparin Dosing Weight: 71.1kg  Vital Signs: Temp: 97.9 F (36.6 C) (06/21 2107) Temp Source: Oral (06/21 2107) BP: 145/86 (06/21 2107) Pulse Rate: 64 (06/21 2107)  Labs: Recent Labs    10/24/17 1820 10/24/17 2202 10/25/17 0121  HGB 16.3  --   --   HCT 48.2  --   --   PLT 201  --   --   APTT 24  --   --   LABPROT 13.3  --   --   INR 1.02  --   --   HEPARINUNFRC  --   --  0.53  CREATININE 1.14  --   --   TROPONINI 0.03* <0.03  --     Estimated Creatinine Clearance: 51.8 mL/min (by C-G formula based on SCr of 1.14 mg/dL).   Medical History: Past Medical History:  Diagnosis Date  . Hyperlipidemia   . Hypertension     Medications:  Scheduled:  . aspirin EC  81 mg Oral Daily  . pravastatin  40 mg Oral q1800  . sodium chloride flush  3 mL Intravenous Q12H    Assessment: Patient is a 77 year old male who initially was called a code STEMI, but this was canceled. Pharmacy consulted to dose heparin drip for possible ACS. First troponin slightly elevated. Baseline labs WNL. No anticoagulation PTA.  Goal of Therapy:  Heparin level 0.3-0.7 units/ml Monitor platelets by anticoagulation protocol: Yes   Plan:  06/22 @ 0100 HL 0.53 therapeutic. Will continue current rate and will recheck @ 0900. Baseline labs WNL.  Thomasene Rippleavid Kellye Mizner, PharmD, BCPS Clinical Pharmacist 10/25/2017

## 2018-12-21 ENCOUNTER — Other Ambulatory Visit: Payer: Self-pay

## 2018-12-21 DIAGNOSIS — Z20822 Contact with and (suspected) exposure to covid-19: Secondary | ICD-10-CM

## 2018-12-22 LAB — NOVEL CORONAVIRUS, NAA: SARS-CoV-2, NAA: DETECTED — AB
